# Patient Record
Sex: Female | Born: 1975 | Race: White | Hispanic: No | Marital: Single
Health system: Southern US, Community
[De-identification: ages and names within clinical notes are randomized; demographics above are authoritative.]

---

## 2012-04-29 ENCOUNTER — Emergency Department: Payer: Self-pay | Admitting: Emergency Medicine

## 2012-04-29 LAB — COMPREHENSIVE METABOLIC PANEL
Alkaline Phosphatase: 58 U/L (ref 50–136)
Anion Gap: 9 (ref 7–16)
BUN: 8 mg/dL (ref 7–18)
Bilirubin,Total: 0.2 mg/dL (ref 0.2–1.0)
Calcium, Total: 7.7 mg/dL — ABNORMAL LOW (ref 8.5–10.1)
Chloride: 112 mmol/L — ABNORMAL HIGH (ref 98–107)
Co2: 26 mmol/L (ref 21–32)
Creatinine: 0.81 mg/dL (ref 0.60–1.30)
EGFR (African American): >60
EGFR (Non-African Amer.): >60
Potassium: 3.7 mmol/L (ref 3.5–5.1)
Sodium: 147 mmol/L — ABNORMAL HIGH (ref 136–145)

## 2012-04-29 LAB — TSH: Thyroid Stimulating Horm: 1.02 u[IU]/mL

## 2012-04-29 LAB — URINALYSIS, COMPLETE
Bilirubin,UR: NEGATIVE
Blood: NEGATIVE
Glucose,UR: NEGATIVE mg/dL (ref 0–75)
Ketone: NEGATIVE
Leukocyte Esterase: NEGATIVE
Specific Gravity: 1.003 (ref 1.003–1.030)

## 2012-04-29 LAB — CBC
HGB: 13.8 g/dL (ref 12.0–16.0)
MCH: 32 pg (ref 26.0–34.0)
MCHC: 33.6 g/dL (ref 32.0–36.0)
Platelet: 259 10*3/uL (ref 150–440)
RDW: 14 % (ref 11.5–14.5)

## 2012-04-29 LAB — DRUG SCREEN, URINE
Barbiturates, Ur Screen: NEGATIVE (ref ?–200)
Cannabinoid 50 Ng, Ur ~~LOC~~: NEGATIVE (ref ?–50)
Cocaine Metabolite,Ur ~~LOC~~: POSITIVE (ref ?–300)
MDMA (Ecstasy)Ur Screen: NEGATIVE (ref ?–500)
Methadone, Ur Screen: NEGATIVE (ref ?–300)
Opiate, Ur Screen: NEGATIVE (ref ?–300)
Phencyclidine (PCP) Ur S: NEGATIVE (ref ?–25)
Tricyclic, Ur Screen: NEGATIVE (ref ?–1000)

## 2012-04-29 LAB — SALICYLATE LEVEL: Salicylates, Serum: 3.9 mg/dL — ABNORMAL HIGH

## 2012-04-29 LAB — ETHANOL: Ethanol %: 0.317 % (ref 0.000–0.080)

## 2012-04-29 LAB — ACETAMINOPHEN LEVEL: Acetaminophen: <2 ug/mL

## 2013-06-25 IMAGING — CT CT HEAD WITHOUT CONTRAST
2 series · 16 of 30 positions shown, 20 images · non-contrast
Comparison: none

REASON FOR EXAM: AMS
COMMENTS:   May transport without cardiac monitor

PROCEDURE:     CT  - CT HEAD WITHOUT CONTRAST  - April 29, 2012  [DATE]
RESULT:     History: Altered mental status.

[Series 2: without · axial · non-contrast · 0.39mm/px · z∈[+578,+702]mm · 13 of 31 slices shown, 17 images]
[im 3/31  brain]
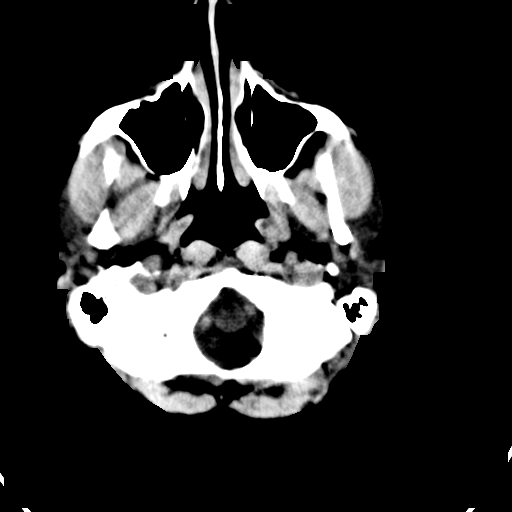
[im 3/31  bone]
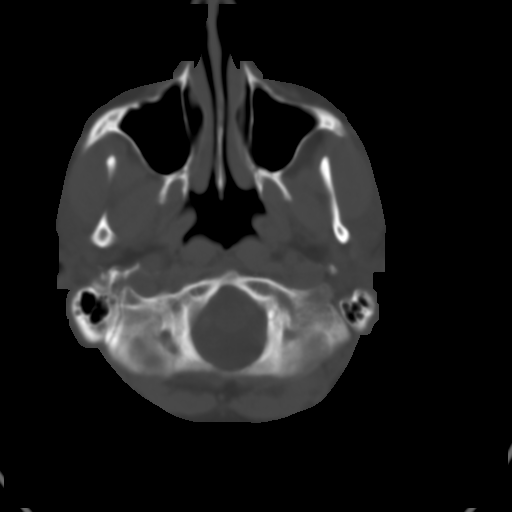
[im 5/31  brain]
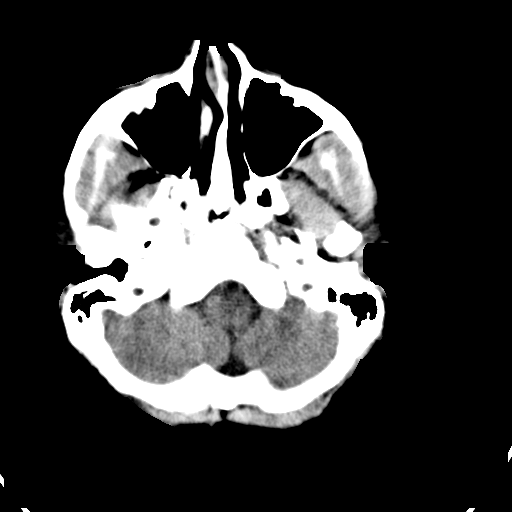
[im 7/31  brain]
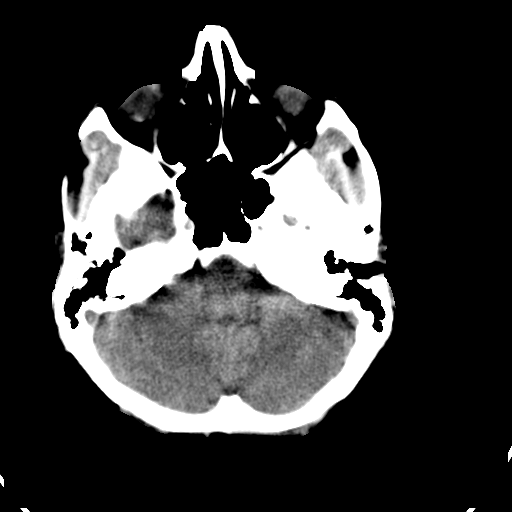
[im 9/31  brain]
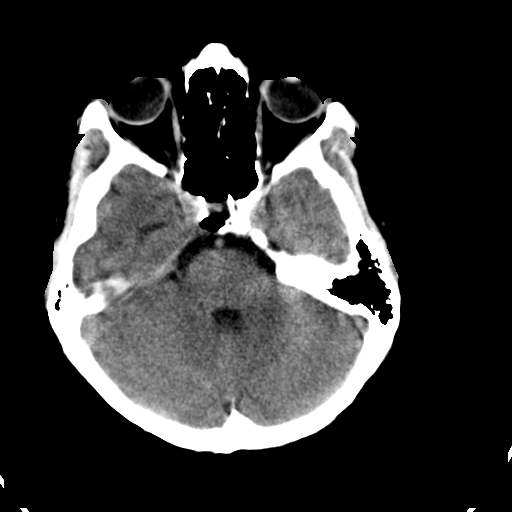
[im 11/31  brain]
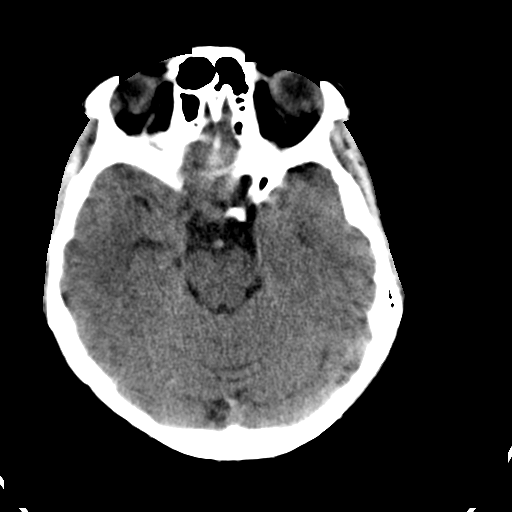
[im 11/31  bone]
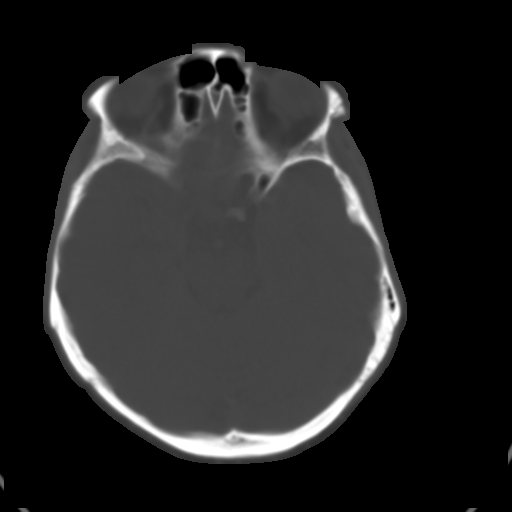
[im 13/31  brain]
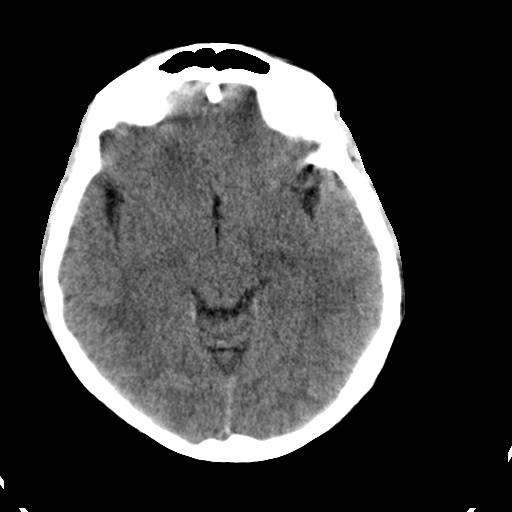
[im 16/31  brain]
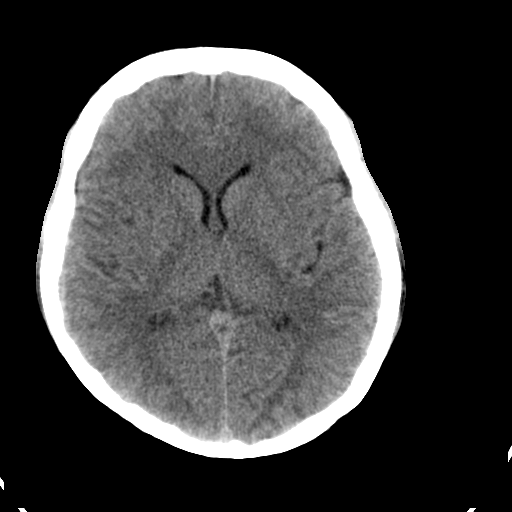
[im 18/31  brain]
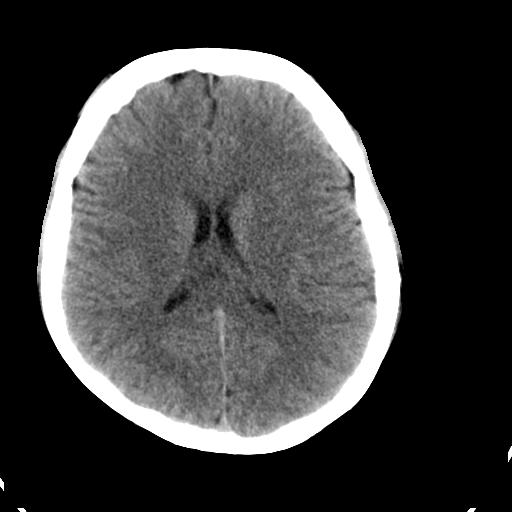
[im 20/31  brain]
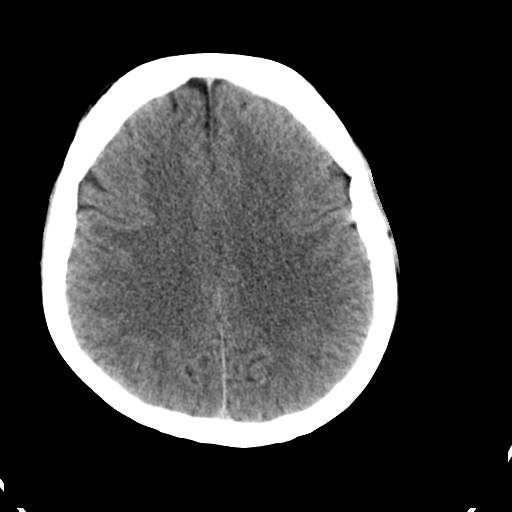
[im 20/31  bone]
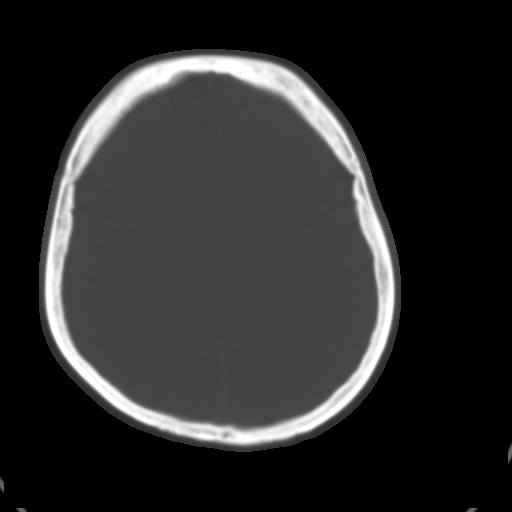
[im 22/31  brain]
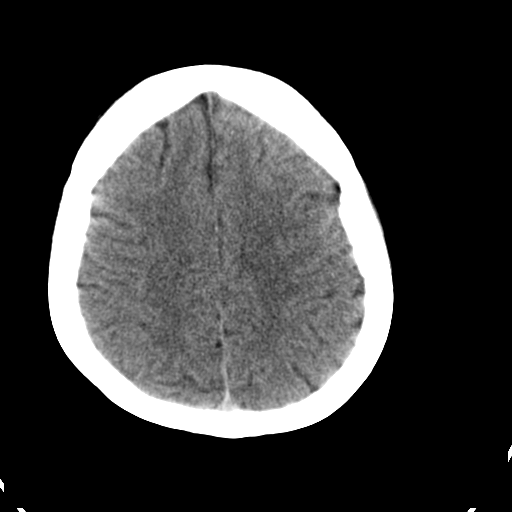
[im 24/31  brain]
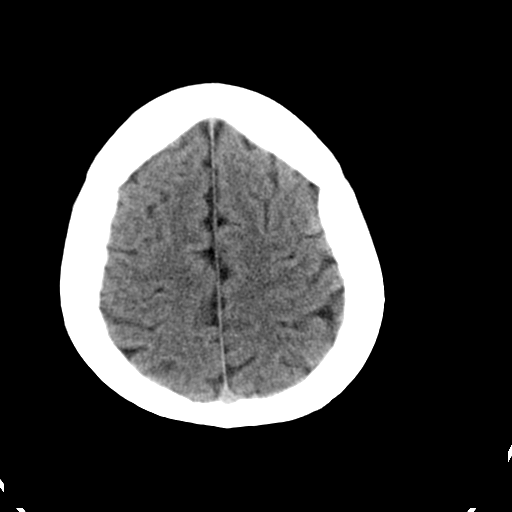
[im 26/31  brain]
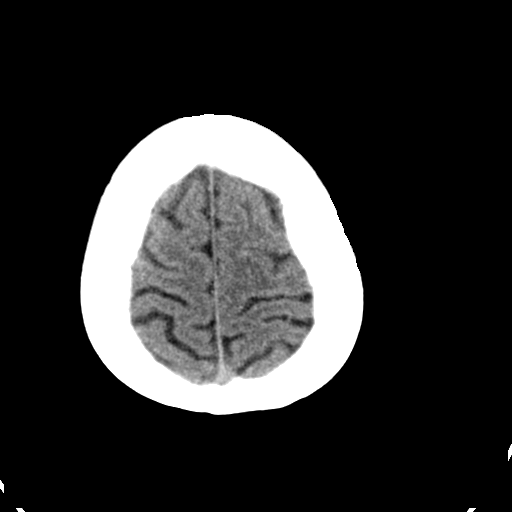
[im 28/31  brain]
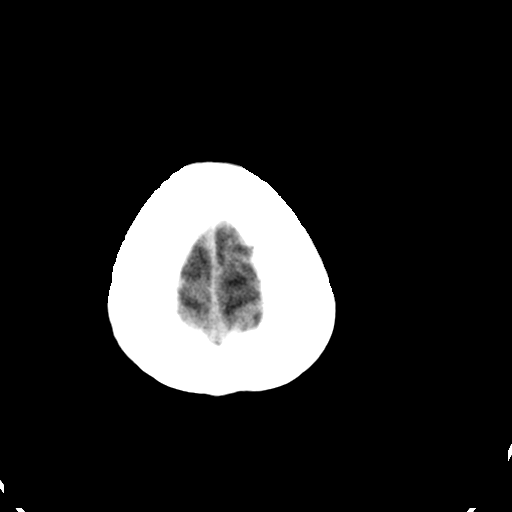
[im 28/31  bone]
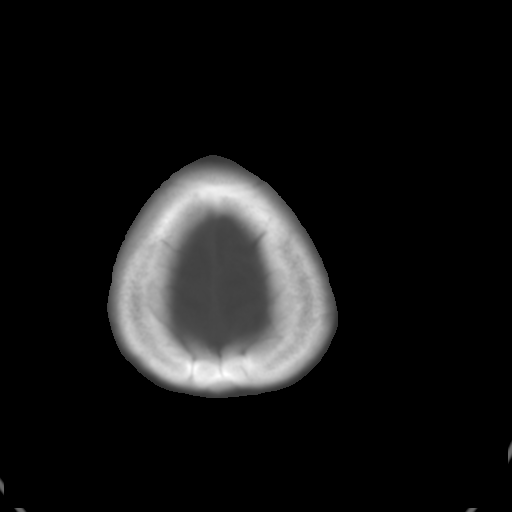

[Series 3: bone · axial · 0.39mm/px · z∈[+578,+618]mm · 3 of 31 slices shown]
[im 3/31  bone]
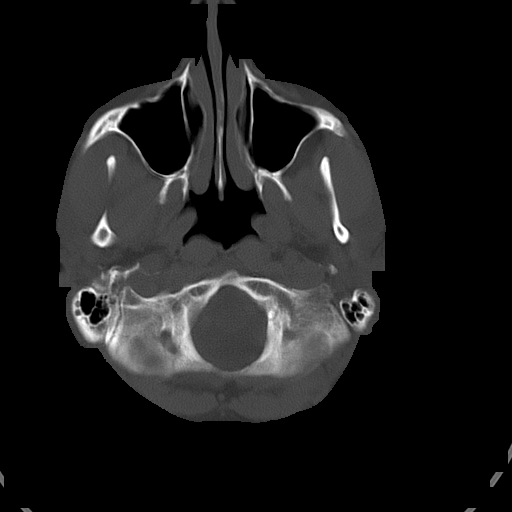
[im 7/31  bone]
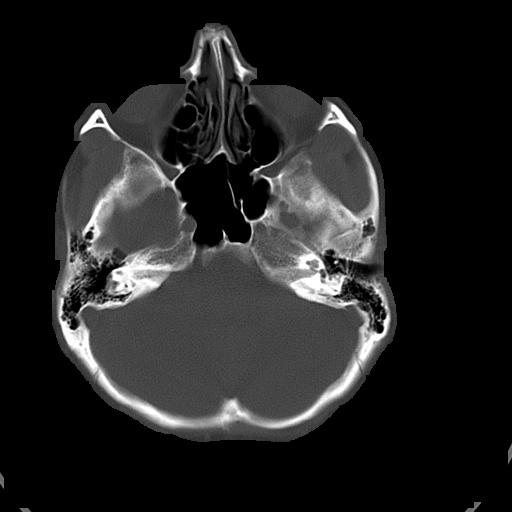
[im 11/31  bone]
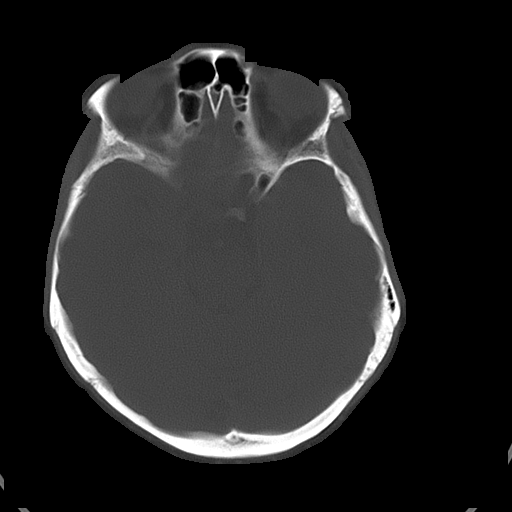

[16 of 30 positions shown; findings below may reference images not displayed]

FINDINGS: Standard nonenhanced CT obtained. No mass. No hydrocephalus. No
hemorrhage. No acute bony abnormality.
IMPRESSION: No acute abnormality.

## 2014-12-22 NOTE — Consult Note (Signed)
Brief Consult Note: Diagnosis: Bipolar affective disorder, alcohol abuse, cocaine abuse.   Patient was seen by consultant.   Consult note dictated.   Recommend further assessment or treatment.   Orders entered.   Discussed with Attending MD.   Comments: Ms. Bascom LevelsFrazier has a h/o bipolar illness. She has been maintained on a combination of Risperdal, Cymbalta and Neurontin with good symptoms stabilization. On her way from [place] to [place], [place] she stopped in [place], got drunk, had her travel companion arrested and was brought to the ER intoxicated. She was placed on IVC when attempted to leave. She was intially uncooperative and irritable.  PLAN: 1. I will restart all her medications.  2. We do not have a bed in [facility]. Will admitt when bed available.   3. I will follow up..  Electronic Signatures: Kristine LineaPucilowska, Gussie Murton (MD)  (Signed 26-Aug-13 16:31)  Authored: Brief Consult Note   Last Updated: 26-Aug-13 16:31 by Kristine LineaPucilowska, Nathanyel Defenbaugh (MD)

## 2014-12-22 NOTE — Consult Note (Signed)
PATIENT NAME:  Sarah Gill, MYNHIER MR#:  [id number] DATE OF BIRTH:  August 08, 1976  DATE OF CONSULTATION:  04/29/2012  REFERRING PHYSICIAN:  Chiquita Loth, MD CONSULTING PHYSICIAN:  Hosea Hanawalt B. Kendyll Huettner, MD  REASON FOR CONSULTATION: To evaluate a patient with altered mental status.   IDENTIFYING DATA: Sarah Gill is a 39 year old female with history of mood instability and substance abuse.   CHIEF COMPLAINT: "I want to go home."   HISTORY OF PRESENT ILLNESS: Sarah Gill has a history of bipolar disorder. She has been maintained on Cymbalta, Risperdal, and trazodone that are prescribed by her psychiatrist in [place] where she also gets her medication at a discount price.   She has been living in the [place] area but also frequency visits her mother in [place], [place]. She was on her way from [facility] to [place], [place] where she intended to share a house with her friend. She was driving there with a female companion. Somehow they stopped in [place].  They both got drunk. The gentleman was arrested. The patient was brought to the Emergency Room. As a result she lost all her belongings and a supply of medications.  Initially she was rather irate and agitated in the Emergency Room. As the day went by she became cool and collected, pleasant, and cooperative,  willing to work with [place] to come up with a proper disposition.  The patient adamantly denies any thoughts of hurting herself or others. She wished to return to [place]. Initially she believed that her mother would be able to drive here and pick her up. It is a  6-7-hour drive. The mother was unable to do so. She was, however, willing to pay for a bus ticket for [name] to return home. The patient adamantly denies any symptoms of depression, anxiety, or psychosis. She denies symptoms suggestive of bipolar mania. She reports good medication compliance, except that she has no access to medication any longer. She was positive for  cocaine and her blood alcohol level on admission was 317. She minimizes substance abuse problems and does not feel that it needs to be addressed, certainly not in a residential setting. She denies prescription pill abuse. She denies daily drinking and does not believe that she needs alcohol. There is no history of complicated detox.   PAST PSYCHIATRIC HISTORY: As above, she was diagnosed with bipolar disorder. She has been established as a patient in the [place] area and has access to cheap medications there.  She is uncertain how this is going to be in [place]. She denies prior suicide attempts or prior psychiatric hospitalizations. She is not very forthcoming with information. She denies substance abuse treatment.   FAMILY PSYCHIATRIC HISTORY: Mother with depression.   PAST MEDICAL HISTORY: None.   ALLERGIES: No known drug allergies.   MEDICATIONS ON ADMISSION: Risperdal unknown dose, Cymbalta unknown dose, trazodone unknown dose.   SOCIAL HISTORY: She was employed at a [employer] while in [place]. She was hoping to get another similar job in [place].  She believes that returning to her mother's house in [place] is her best and safest option. She feels that she will be ready to travel by bus. She denies any legal problems.   REVIEW OF SYSTEMS: CONSTITUTIONAL: No fevers or chills. No weight changes. EYES: No double or blurred vision, ENT: No hearing loss. RESPIRATORY: No shortness of breath or cough. CARDIOVASCULAR: No chest pain or orthopnea. GASTROINTESTINAL: No abdominal pain, nausea, vomiting, or diarrhea. GU: No incontinence or frequency. ENDOCRINE:  No heat or cold intolerance. LYMPHATIC: No anemia or easy bruising.  INTEGUMENT: No acne or rash. MUSCULOSKELETAL: No muscle or joint pain. NEUROLOGIC: No tingling or weakness. PSYCHIATRIC: See history of present illness for details.   PHYSICAL EXAMINATION:  VITAL SIGNS: Blood pressure 129/79, pulse 75, respirations 20,  temperature 96.9.   GENERAL: This is a well-developed female in no acute distress. The rest of the physical examination is deferred to her primary attending.   LABORATORY DATA: Chemistries are within normal limits except for sodium of 147, low calcium of 7.7, blood alcohol level 0.317. LFTs within normal limits. TSH 1.02. Urine tox screen positive for cocaine. CBC within normal limits. Urinalysis is not suggestive of urinary tract infection. Serum acetaminophen less than 2. Serum salicylates 3.9. Urine pregnancy test is negative.   MENTAL STATUS EXAMINATION: The patient was sleepy and irritable in the morning. In the afternoon she was alert and oriented to person, place, time, and somewhat to situation. She was pleasant, polite, and cooperative. She is in bed wearing hospital scrubs. She maintains good eye contact. Her speech is of normal rhythm, rate, and volume. Mood is fine with full affect. Thought processing is logical and goal oriented. Thought content: She denies suicidal or homicidal ideations. There are no delusions or paranoia. There are no auditory or visual hallucinations. Her cognition is grossly intact. Her insight and judgment are questionable. Suicide risk assessment: This is a patient with a history of mood instability and substance abuse who adamantly denies any prior history of suicide attempt and denies thoughts of hurting herself or others. She is at increased risk of suicide due to ongoing substance abuse.   DIAGNOSES:   AXIS I: Bipolar affective disorder. Alcohol abuse. Cocaine abuse.   AXIS II: Deferred.   AXIS III: None.   AXIS IV: Chronic mental illness, substance abuse, housing, primary support, employment, financial, access to care.   AXIS V: GAF 45.   PLAN:  1. The patient no longer meets criteria for involuntary inpatient psychiatric commitment. I will terminate  proceedings. Please discharge in the morning as appropriate.  2. I restarted her on her medications,  Risperdal 3 mg at night, Cymbalta 60 mg daily, trazodone 100 mg at night. She was given prescription for that.  3. Her family will buy her a bus ticket to return to [place]. The mother will call the Emergency Room in the morning with the information.  4. Casandra DoffingKen Smith will chaperone her to the bus station that leaves at 9:00. We may be able to give her a taxi voucher.  5. We made an appointment for her at the mental health center in [place] on August 29 in the morning.  6. Please contact our intake nurse or page me if  problems.      7. I will follow up if necessary.    ____________________________ Ellin GoodieJolanta B. Jennet MaduroPucilowska, MD [id number] D: 04/30/2012 [id number] ET T: 05/01/2012 09:48:14 ET JOB#: [id number]  cc: Ellwyn Ergle B. Jennet MaduroPucilowska, MD, <Dictator> Shari ProwsJOLANTA B Dodge Ator MD ELECTRONICALLY SIGNED 05/02/2012 22:19

## 2014-12-22 NOTE — Consult Note (Signed)
Brief Consult Note: Diagnosis: Bipolar affective disorder, alcohol abuse, cocaine abuse.   Patient was seen by consultant.   Consult note dictated.   Recommend further assessment or treatment.   Orders entered.   Comments: Sarah Gill has a h/o bipolar illness. She has been maintained on a combination of Risperdal, Cymbalta and Neurontin with good symptoms stabilization. On her way from [place] to [place], [place] she stopped in [place], got drunk, had her travel companion arrested and was brought to the ER intoxicated. She was placed on IVC when attempted to leave. She was intially uncooperative and irritable. This has improved as she sobered up. We changed our plans several times as new information was received. The current plan is as follows.   PLAN: 1.The patient no longer meets criteria for IVC. I terminated proceeding. Paperwork was fowarded to [employer]. Please discharge in the morning.   2. I will restarted her on medications. Rx on chart.   3. Her family will buy her a bus ticket to return to [place]. The mother will call ER in the morning with the information .   4. Theodosia PalingKent Smith will chaperone her to the bus station in the morning. Bus leaves at 9:00 am.   5. We made appointment for her at the mental health center in [place] for 05/02/2012 in the morning.   6. Please contact intake nurse or page me if problems.    3. I will follow up.  Electronic Signatures: Kristine LineaPucilowska, Ceonna Frazzini (MD)  (Signed 27-Aug-13 01:51)  Authored: Brief Consult Note   Last Updated: 27-Aug-13 01:51 by Kristine LineaPucilowska, Ashaad Gaertner (MD)
